# Patient Record
Sex: Male | Born: 1985 | Race: White | Hispanic: No | Marital: Single | Smoking: Never smoker
Health system: Southern US, Community
[De-identification: ages and names within clinical notes are randomized; demographics above are authoritative.]

## PROBLEM LIST (undated history)

## (undated) DIAGNOSIS — C801 Malignant (primary) neoplasm, unspecified: Secondary | ICD-10-CM

## (undated) DIAGNOSIS — I10 Essential (primary) hypertension: Secondary | ICD-10-CM

## (undated) DIAGNOSIS — E079 Disorder of thyroid, unspecified: Secondary | ICD-10-CM

## (undated) HISTORY — PX: OTHER SURGICAL HISTORY: SHX169

---

## 2018-12-04 ENCOUNTER — Emergency Department (HOSPITAL_COMMUNITY)
Admission: EM | Admit: 2018-12-04 | Discharge: 2018-12-04 | Disposition: A | Discharge status: Home / Self Care | Payer: BLUE CROSS/BLUE SHIELD | Attending: Emergency Medicine | Admitting: Emergency Medicine

## 2018-12-04 ENCOUNTER — Other Ambulatory Visit: Payer: Self-pay

## 2018-12-04 ENCOUNTER — Emergency Department (HOSPITAL_COMMUNITY): Payer: BLUE CROSS/BLUE SHIELD

## 2018-12-04 ENCOUNTER — Encounter (HOSPITAL_COMMUNITY): Payer: Self-pay | Admitting: Emergency Medicine

## 2018-12-04 DIAGNOSIS — S99912A Unspecified injury of left ankle, initial encounter: Secondary | ICD-10-CM | POA: Diagnosis present

## 2018-12-04 DIAGNOSIS — Y929 Unspecified place or not applicable: Secondary | ICD-10-CM | POA: Insufficient documentation

## 2018-12-04 DIAGNOSIS — Y999 Unspecified external cause status: Secondary | ICD-10-CM | POA: Insufficient documentation

## 2018-12-04 DIAGNOSIS — S93402A Sprain of unspecified ligament of left ankle, initial encounter: Secondary | ICD-10-CM | POA: Diagnosis not present

## 2018-12-04 DIAGNOSIS — X58XXXA Exposure to other specified factors, initial encounter: Secondary | ICD-10-CM | POA: Diagnosis not present

## 2018-12-04 DIAGNOSIS — Z85850 Personal history of malignant neoplasm of thyroid: Secondary | ICD-10-CM | POA: Diagnosis not present

## 2018-12-04 DIAGNOSIS — Y939 Activity, unspecified: Secondary | ICD-10-CM | POA: Diagnosis not present

## 2018-12-04 HISTORY — DX: Disorder of thyroid, unspecified: E07.9

## 2018-12-04 HISTORY — DX: Malignant (primary) neoplasm, unspecified: C80.1

## 2018-12-04 MED ORDER — KETOROLAC TROMETHAMINE 30 MG/ML IJ SOLN
30.0000 mg | Freq: Once | INTRAMUSCULAR | Status: AC
  Administered 2018-12-04: 30 mg via INTRAMUSCULAR
  Filled 2018-12-04: qty 1

## 2018-12-04 MED ORDER — NAPROXEN 500 MG PO TABS: 500.0000 mg | ORAL_TABLET | Freq: Two times a day (BID) | ORAL | 0 refills | Status: AC

## 2018-12-04 NOTE — ED Notes (Signed)
RN placed icebag on patient's ankle. Obvious swelling and discoloration noted to left ankle on the medial side

## 2018-12-04 NOTE — ED Provider Notes (Signed)
[facility] DEPT Provider Note   CSN: [id number] Arrival date & time: 12/04/18  1048    History   Chief Complaint Chief Complaint  Patient presents with  . Ankle Pain    HPI Martin Fuller is a 33 y.o. male.     33 y.o male with a PMH of Thyroid disease presents to the ED with a chief complaint of left ankle pain x3 weeks.  Patient had an episode 3 weeks ago where he began feeling pain along his Achilles tendon on the left side, he reports he went to an urgent care where he was given a boot along with told to take Tylenol for his pain.  He reports removing this boot, states the pain has gotten worse, states that he did not have x-rays obtained.  He reports the pain is worse with ambulation, palpation, he also reports feeling a "knot "on his left ankle.  Patient describes the pain as a shooting pain radiating from the left ankle all the way to his upper thigh.  He has been taking Tylenol for his pain but reports no improvement in symptoms.  Patient has not followed up with Ortho.  He denies any fevers, other injuries.     Past Medical History:  Diagnosis Date  . Cancer ([place])    thyroid  . Thyroid disease     There are no active problems to display for this patient.   Past Surgical History:  Procedure Laterality Date  . thyroidect          Home Medications    Prior to Admission medications   Medication Sig Start Date End Date Taking? Authorizing Provider  naproxen (NAPROSYN) 500 MG tablet Take 1 tablet (500 mg total) by mouth 2 (two) times daily for 7 days. 12/04/18 12/11/18  Janeece Fitting, PA-C    Family History No family history on file.  Social History Social History   Tobacco Use  . Smoking status: Never Smoker  . Smokeless tobacco: Never Used  Substance Use Topics  . Alcohol use: Yes  . Drug use: Not on file     Allergies   Patient has no known allergies.   Review of Systems Review of Systems  Constitutional: Negative  for chills and fever.  HENT: Negative for ear pain and sore throat.   Eyes: Negative for pain and visual disturbance.  Respiratory: Negative for cough and shortness of breath.   Cardiovascular: Negative for chest pain and palpitations.  Gastrointestinal: Negative for abdominal pain and vomiting.  Genitourinary: Negative for dysuria and hematuria.  Musculoskeletal: Positive for arthralgias and myalgias. Negative for back pain.  Skin: Negative for color change and rash.  Neurological: Negative for seizures and syncope.  All other systems reviewed and are negative.    Physical Exam Updated Vital Signs BP 116/65 (BP Location: Left Arm)   Pulse (!) 57   Temp 98.1 F (36.7 C) (Oral)   Resp 18   Ht 6\' 2"  (1.88 m)   Wt 80.7 kg   SpO2 99%   BMI 22.85 kg/m   Physical Exam Vitals signs and nursing note reviewed.  Constitutional:      Appearance: He is well-developed.  HENT:     Head: Normocephalic and atraumatic.  Eyes:     General: No scleral icterus.    Pupils: Pupils are equal, round, and reactive to light.  Neck:     Musculoskeletal: Normal range of motion.  Cardiovascular:     Heart sounds: Normal heart sounds.  Pulmonary:     Effort: Pulmonary effort is normal.     Breath sounds: Normal breath sounds. No wheezing.  Chest:     Chest wall: No tenderness.  Abdominal:     General: Bowel sounds are normal. There is no distension.     Palpations: Abdomen is soft.     Tenderness: There is no abdominal tenderness.  Musculoskeletal:        General: No tenderness or deformity.     Left ankle: He exhibits swelling. He exhibits normal range of motion.     Comments: Pulses present, capillary refill is intact.  Mild swelling noted to the left ankle.  Full range of motion.  Pain with plantarflexion, strength 5 out of 5 with dorsiflexion and plantarflexion.  Skin:    General: Skin is warm and dry.  Neurological:     Mental Status: He is alert and oriented to person, place, and  time.      ED Treatments / Results  Labs (all labs ordered are listed, but only abnormal results are displayed) Labs Reviewed - No data to display  EKG None  Radiology Dg Ankle 2 Views Left  Result Date: 12/04/2018 CLINICAL DATA:  Left foot and ankle pain. Achilles tendon injury 3 weeks ago. EXAM: LEFT ANKLE - 2 VIEW COMPARISON:  None. FINDINGS: There is no evidence of fracture, dislocation, or joint effusion. There is no evidence of arthropathy or other focal bone abnormality. Soft tissues are unremarkable. IMPRESSION: Normal examination. Electronically Signed   By: Claudie Revering M.D.   On: 12/04/2018 13:21    Procedures Procedures (including critical care time)  Medications Ordered in ED Medications  ketorolac (TORADOL) 30 MG/ML injection 30 mg (has no administration in time range)     Initial Impression / Assessment and Plan / ED Course  I have reviewed the triage vital signs and the nursing notes.  Pertinent labs & imaging results that were available during my care of the patient were reviewed by me and considered in my medical decision making (see chart for details).    Patient with no PMH the ED with chief complaint of left ankle pain x3 weeks.  Patient was originally seen in urgent care, thought to be Achilles tendinitis, was given a air boot along with Tylenol for pain.  HE reports his symptoms are not improving, he continues to have swelling to the left ankle reports his symptoms have not improved.  During evaluation there is swelling noted to the left ankle, no calf tenderness, does have pain with plantarflexion, no plantar dorsiflexion.  Strength is 5 out of 5, pulses are present and remains neurovascularly intact. X-ray of the left ankle showed: No effusion, dislocation, fracture.  Discussed with patient he will ultimately need orthopedic follow-up.  Will provide him with a splint to keep his foot in neutral, will also provide Toradol while in the ED, he will be going  home with a prescription for naproxen, rice therapy recommended.  Patient understands and agrees with management, with otherwise stable vital signs.  Patient stable for discharge.   Portions of this note were generated with [employer]. Dictation errors may occur despite best attempts at proofreading.   Final Clinical Impressions(s) / ED Diagnoses   Final diagnoses:  Sprain of left ankle, unspecified ligament, initial encounter    ED Discharge Orders         Ordered    naproxen (NAPROSYN) 500 MG tablet  2 times daily     12/04/18 1351  Janeece Fitting, PA-C 12/04/18 1412    Lennice Sites, DO 12/04/18 1513

## 2018-12-04 NOTE — Discharge Instructions (Addendum)
Your xray today was normal. The number to Dr. Mardelle Matte is attached to your chart, please schedule an appointment to further evaluate your left ankle sprain.

## 2018-12-04 NOTE — ED Triage Notes (Signed)
3 weeks injured achilles tendon and had a boot that wore for couple days and move it like told by UC. Reports that having pains in left foot/ankle area is very painful esp with weight bearing.

## 2019-09-05 ENCOUNTER — Ambulatory Visit: Payer: 59 | Attending: Internal Medicine

## 2019-09-05 DIAGNOSIS — Z23 Encounter for immunization: Secondary | ICD-10-CM

## 2019-09-05 NOTE — Progress Notes (Signed)
   Covid-19 Vaccination Clinic  Name:  Martin Fuller    MRN: [id number] DOB: 04-30-1986  09/05/2019  Martin Fuller was observed post Covid-19 immunization for 15 minutes without incident. He was provided with Vaccine Information Sheet and instruction to access the V-Safe system.   Martin Fuller was instructed to call 911 with any severe reactions post vaccine: [name] Kitchen Difficulty breathing  . Swelling of face and throat  . A fast heartbeat  . A bad rash all over body  . Dizziness and weakness   Immunizations Administered    Name Date Dose VIS Date Route   Pfizer COVID-19 Vaccine 09/05/2019 12:23 PM 0.3 mL 05/31/2019 Intramuscular   Manufacturer: [place]   Lot: [id number]   [place]: [id number]

## 2019-09-30 ENCOUNTER — Ambulatory Visit: Payer: 59 | Attending: Internal Medicine

## 2019-09-30 DIAGNOSIS — Z23 Encounter for immunization: Secondary | ICD-10-CM

## 2019-09-30 NOTE — Progress Notes (Signed)
   Covid-19 Vaccination Clinic  Name:  Martin Fuller    MRN: [id number] DOB: 03-07-1986  09/30/2019  Martin Fuller was observed post Covid-19 immunization for 15 minutes without incident. He was provided with Vaccine Information Sheet and instruction to access the V-Safe system.   Martin Fuller was instructed to call 911 with any severe reactions post vaccine: [name] Kitchen Difficulty breathing  . Swelling of face and throat  . A fast heartbeat  . A bad rash all over body  . Dizziness and weakness   Immunizations Administered    Name Date Dose VIS Date Route   Pfizer COVID-19 Vaccine 09/30/2019 11:02 AM 0.3 mL 05/31/2019 Intramuscular   Manufacturer: [place]   Lot: [id number]   [place]: [id number]

## 2020-10-26 ENCOUNTER — Emergency Department (HOSPITAL_COMMUNITY)
Admission: EM | Admit: 2020-10-26 | Discharge: 2020-10-26 | Disposition: A | Discharge status: Home / Self Care | Payer: 59 | Attending: Emergency Medicine | Admitting: Emergency Medicine

## 2020-10-26 ENCOUNTER — Other Ambulatory Visit: Payer: Self-pay

## 2020-10-26 DIAGNOSIS — R1084 Generalized abdominal pain: Secondary | ICD-10-CM | POA: Insufficient documentation

## 2020-10-26 DIAGNOSIS — Z85850 Personal history of malignant neoplasm of thyroid: Secondary | ICD-10-CM | POA: Diagnosis not present

## 2020-10-26 DIAGNOSIS — R197 Diarrhea, unspecified: Secondary | ICD-10-CM | POA: Diagnosis not present

## 2020-10-26 DIAGNOSIS — R1031 Right lower quadrant pain: Secondary | ICD-10-CM | POA: Diagnosis present

## 2020-10-26 LAB — URINALYSIS, ROUTINE W REFLEX MICROSCOPIC
Bacteria, UA: NONE SEEN
Bilirubin Urine: NEGATIVE
Glucose, UA: NEGATIVE mg/dL
Ketones, ur: NEGATIVE mg/dL
Leukocytes,Ua: NEGATIVE
Nitrite: NEGATIVE
Protein, ur: NEGATIVE mg/dL
Specific Gravity, Urine: 1.005 (ref 1.005–1.030)
pH: 7 (ref 5.0–8.0)

## 2020-10-26 LAB — CBC
HCT: 44.3 % (ref 39.0–52.0)
Hemoglobin: 15.2 g/dL (ref 13.0–17.0)
MCH: 30.5 pg (ref 26.0–34.0)
MCHC: 34.3 g/dL (ref 30.0–36.0)
MCV: 88.8 fL (ref 80.0–100.0)
Platelets: 292 10*3/uL (ref 150–400)
RBC: 4.99 MIL/uL (ref 4.22–5.81)
RDW: 13 % (ref 11.5–15.5)
WBC: 7.3 10*3/uL (ref 4.0–10.5)
nRBC: 0 % (ref 0.0–0.2)

## 2020-10-26 LAB — COMPREHENSIVE METABOLIC PANEL
ALT: 22 U/L (ref 0–44)
AST: 22 U/L (ref 15–41)
Albumin: 4.2 g/dL (ref 3.5–5.0)
Alkaline Phosphatase: 74 U/L (ref 38–126)
Anion gap: 6 (ref 5–15)
BUN: 8 mg/dL (ref 6–20)
CO2: 25 mmol/L (ref 22–32)
Calcium: 9.4 mg/dL (ref 8.9–10.3)
Chloride: 105 mmol/L (ref 98–111)
Creatinine, Ser: 0.86 mg/dL (ref 0.61–1.24)
GFR, Estimated: >60 mL/min (ref >60)
Glucose, Bld: 97 mg/dL (ref 70–99)
Potassium: 4.2 mmol/L (ref 3.5–5.1)
Sodium: 136 mmol/L (ref 135–145)
Total Bilirubin: 0.6 mg/dL (ref 0.3–1.2)
Total Protein: 7 g/dL (ref 6.5–8.1)

## 2020-10-26 LAB — LIPASE, BLOOD: Lipase: 28 U/L (ref 11–51)

## 2020-10-26 MED ORDER — AMOXICILLIN-POT CLAVULANATE 875-125 MG PO TABS: 1.0000 | ORAL_TABLET | Freq: Two times a day (BID) | ORAL | 0 refills | Status: DC

## 2020-10-26 NOTE — Discharge Instructions (Addendum)
Drink clear liquids until your stomach feels better. Then, slowly introduce bland foods into your diet as tolerated, such as bread, rice, apples, bananas. Follow up with your primary care if symptoms persist. Return to the ER for severe abdominal pain, large amounts of blood in your stool, fever, uncontrollable vomiting, or new or concerning symptoms.

## 2020-10-26 NOTE — ED Triage Notes (Signed)
Pt presents to the ED with abdominal cramping and diarrhea sudden onset last night at midnight. Pt states he also has been breaking out in sweats. Denies fevers.

## 2020-10-26 NOTE — ED Provider Notes (Addendum)
[facility] EMERGENCY DEPARTMENT Provider Note   CSN: [id number] Arrival date & time: 10/26/20  1109     History Chief Complaint  Patient presents with  . Abdominal Pain    Martin Fuller is a 35 y.o. male with PMHx thyroid cancer s/p thyroidectomy, presenting for evaluation of diarrhea and abdominal pain.  Patient states he woke up overnight and began having significant abdominal pain that was generalized in his abdomen with diarrhea.  He had many episodes of diarrhea.  Today his bowel movements are more mucousy with a tinge of bright red/pink blood.  He has some irritation in his rectum from the frequent bowel movements.  No known history of hemorrhoids.  His abdominal pain is much improved since overnight.  No recent travel.  His whole family had the same meal with him last night, none are sick.  No nausea or vomiting, no urinary symptoms, no fevers.  While he was having a bowel movement last night he did have a near syncopal episode where he felt very lightheaded, felt his vision going, felt diaphoretic.  He did not lose consciousness.   History of appendectomy.  No history of diverticulitis.  The history is provided by the patient.       Past Medical History:  Diagnosis Date  . Cancer ([place])    thyroid  . Thyroid disease     There are no problems to display for this patient.   Past Surgical History:  Procedure Laterality Date  . thyroidect         No family history on file.  Social History   Tobacco Use  . Smoking status: Never Smoker  . Smokeless tobacco: Never Used  Vaping Use  . Vaping Use: Some days  . Substances: Flavoring  Substance Use Topics  . Alcohol use: Yes    Home Medications Prior to Admission medications   Medication Sig Start Date End Date Taking? Authorizing Provider  amoxicillin-clavulanate (AUGMENTIN) 875-125 MG tablet Take 1 tablet by mouth every 12 (twelve) hours. 10/26/20  Yes Tommie Bohlken, [place] N, PA-C    Allergies     Patient has no known allergies.  Review of Systems   Review of Systems  Gastrointestinal: Positive for abdominal pain, blood in stool and diarrhea.  All other systems reviewed and are negative.      Physical Exam Updated Vital Signs BP 125/69   Pulse (!) 48   Temp 98 F (36.7 C) (Oral)   Resp 18   Ht 6\' 2"  (1.88 m)   Wt 81 kg   SpO2 97%   BMI 22.93 kg/m   Physical Exam Vitals and nursing note reviewed.  Constitutional:      General: He is not in acute distress.    Appearance: He is well-developed. He is not ill-appearing.  HENT:     Head: Normocephalic and atraumatic.  Eyes:     Conjunctiva/sclera: Conjunctivae normal.  Cardiovascular:     Rate and Rhythm: Normal rate and regular rhythm.  Pulmonary:     Effort: Pulmonary effort is normal. No respiratory distress.     Breath sounds: Normal breath sounds.  Abdominal:     General: Abdomen is flat. Bowel sounds are normal.     Palpations: Abdomen is soft.     Tenderness: There is abdominal tenderness in the right upper quadrant, right lower quadrant and left lower quadrant. There is no guarding or rebound.  Skin:    General: Skin is warm.  Neurological:  Mental Status: He is alert.  Psychiatric:        Behavior: Behavior normal.     ED Results / Procedures / Treatments   Labs (all labs ordered are listed, but only abnormal results are displayed) Labs Reviewed  URINALYSIS, ROUTINE W REFLEX MICROSCOPIC - Abnormal; Notable for the following components:      Result Value   Color, Urine STRAW (*)    Hgb urine dipstick SMALL (*)    All other components within normal limits  LIPASE, BLOOD  COMPREHENSIVE METABOLIC PANEL  CBC    EKG None  Radiology No results found.  Procedures Procedures   Medications Ordered in ED Medications - No data to display  ED Course  I have reviewed the triage vital signs and the nursing notes.  Pertinent labs & imaging results that were available during my care of the  patient were reviewed by me and considered in my medical decision making (see chart for details).    MDM Rules/Calculators/A&P                          Presenting for generalized abdominal pain, diarrhea that began last night.  Abdominal pain is improving.  He is mostly generalized tenderness without guarding or rebound.  He is also having some bloody mucousy stool today.  Patient refused rectal exam.  Blood work is very reassuring, no leukocytosis, hemoglobin is within normal limits, no electrolyte derangements, normal renal and hepatic function, normal lipase.  He is afebrile with stable vital signs.  Also discussed CT imaging of his abdomen and had shared decision making.  Patient would like to avoid imaging, believe this is very reasonable. He does not have an acute abdomen. Suspect this is viral though discussed potential for diverticulitis (seems less likely for age) or other infectious colitis.  Will cover with Augmentin, instructed oral hydration clear liquid diet and slowly advance as tolerated.  Close PCP follow-up, strict return precautions.  Patient is discharged in no distress.  Discussed results, findings, treatment and follow up. Patient advised of return precautions. Patient verbalized understanding and agreed with plan.  Final Clinical Impression(s) / ED Diagnoses Final diagnoses:  Diarrhea, unspecified type  Generalized abdominal pain    Rx / DC Orders ED Discharge Orders         Ordered    amoxicillin-clavulanate (AUGMENTIN) 875-125 MG tablet  Every 12 hours        10/26/20 1559           [place] Lozito, [place] N, PA-C 10/26/20 1602    Stori Royse, [place] N, PA-C 10/26/20 2956    Varney Biles, MD 10/27/20 0002

## 2021-01-22 ENCOUNTER — Emergency Department (HOSPITAL_COMMUNITY): Payer: 59

## 2021-01-22 ENCOUNTER — Emergency Department (HOSPITAL_COMMUNITY)
Admission: EM | Admit: 2021-01-22 | Discharge: 2021-01-23 | Disposition: A | Discharge status: Home / Self Care | Payer: 59 | Attending: Emergency Medicine | Admitting: Emergency Medicine

## 2021-01-22 ENCOUNTER — Other Ambulatory Visit: Payer: Self-pay

## 2021-01-22 ENCOUNTER — Encounter (HOSPITAL_COMMUNITY): Payer: Self-pay

## 2021-01-22 DIAGNOSIS — R0789 Other chest pain: Secondary | ICD-10-CM | POA: Diagnosis present

## 2021-01-22 DIAGNOSIS — R0602 Shortness of breath: Secondary | ICD-10-CM | POA: Insufficient documentation

## 2021-01-22 DIAGNOSIS — R079 Chest pain, unspecified: Secondary | ICD-10-CM

## 2021-01-22 DIAGNOSIS — Z85850 Personal history of malignant neoplasm of thyroid: Secondary | ICD-10-CM | POA: Diagnosis not present

## 2021-01-22 DIAGNOSIS — F419 Anxiety disorder, unspecified: Secondary | ICD-10-CM | POA: Diagnosis not present

## 2021-01-22 LAB — BASIC METABOLIC PANEL
Anion gap: 10 (ref 5–15)
BUN: 9 mg/dL (ref 6–20)
CO2: 23 mmol/L (ref 22–32)
Calcium: 9.1 mg/dL (ref 8.9–10.3)
Chloride: 103 mmol/L (ref 98–111)
Creatinine, Ser: 1.02 mg/dL (ref 0.61–1.24)
GFR, Estimated: >60 mL/min (ref >60)
Glucose, Bld: 96 mg/dL (ref 70–99)
Potassium: 3.6 mmol/L (ref 3.5–5.1)
Sodium: 136 mmol/L (ref 135–145)

## 2021-01-22 LAB — CBC WITH DIFFERENTIAL/PLATELET
Abs Immature Granulocytes: 0.04 10*3/uL (ref 0.00–0.07)
Basophils Absolute: 0.1 10*3/uL (ref 0.0–0.1)
Basophils Relative: 0 %
Eosinophils Absolute: 0.1 10*3/uL (ref 0.0–0.5)
Eosinophils Relative: 1 %
HCT: 43.2 % (ref 39.0–52.0)
Hemoglobin: 15.2 g/dL (ref 13.0–17.0)
Immature Granulocytes: 0 %
Lymphocytes Relative: 16 %
Lymphs Abs: 1.9 10*3/uL (ref 0.7–4.0)
MCH: 30.6 pg (ref 26.0–34.0)
MCHC: 35.2 g/dL (ref 30.0–36.0)
MCV: 86.9 fL (ref 80.0–100.0)
Monocytes Absolute: 0.8 10*3/uL (ref 0.1–1.0)
Monocytes Relative: 7 %
Neutro Abs: 8.7 10*3/uL — ABNORMAL HIGH (ref 1.7–7.7)
Neutrophils Relative %: 76 %
Platelets: 310 10*3/uL (ref 150–400)
RBC: 4.97 MIL/uL (ref 4.22–5.81)
RDW: 12.9 % (ref 11.5–15.5)
WBC: 11.5 10*3/uL — ABNORMAL HIGH (ref 4.0–10.5)
nRBC: 0 % (ref 0.0–0.2)

## 2021-01-22 LAB — TROPONIN I (HIGH SENSITIVITY): Troponin I (High Sensitivity): 11 ng/L (ref <18)
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
## 2021-01-22 NOTE — ED Provider Notes (Signed)
Emergency Medicine Provider Triage Evaluation Note  Martin Fuller , a 35 y.o. male  was evaluated in triage.  Pt complains of chest pain.  Chest pain started 1.5 hours prior.  Chest pain is located to the left side of his chest.  Patient describes pain as a dullness bordering on sharp pain.  No radiation of pain.  No alleviating or aggravating factors.  Patient endorses associated diaphoresis at onset of pain.  Patient also endorses shortness of breath.  No nausea, vomiting, abdominal pain, back pain, palpitations, leg swelling.  Patient denies any history of PE or DVT, cancer treatment, hormone therapy, surgery in the last 4 weeks, hemoptysis.  Review of Systems  Positive: Chest pain, diaphoresis, shortness of breath Negative: nausea, vomiting, abdominal pain, back pain, palpitations, leg swelling  Physical Exam  BP (!) 149/101 (BP Location: Right Arm)   Pulse 89   Temp 98.3 F (36.8 C)   Resp 18   Ht '6\' 2"'$  (1.88 m)   Wt 93.4 kg   SpO2 98%   BMI 26.45 kg/m  Gen:   Awake, no distress   Resp:  Normal effort, lungs clear to auscultation bilaterally MSK:   Moves extremities without difficulty, no swelling or tenderness to bilateral lower extremities Other:  +2 Radial pulse bilaterally.  Medical Decision Making  Medically screening exam initiated at 8:11 PM.  Appropriate orders placed.  Saksham Hardebeck was informed that the remainder of the evaluation will be completed by another provider, this initial triage assessment does not replace that evaluation, and the importance of remaining in the ED until their evaluation is complete.  The patient appears stable so that the remainder of the work up may be completed by another provider.      Dyann Ruddle 01/22/21 2013    Drenda Freeze, MD 01/22/21 [phone]

## 2021-01-23 LAB — TROPONIN I (HIGH SENSITIVITY): Troponin I (High Sensitivity): 15 ng/L (ref <18)

## 2021-01-23 NOTE — ED Notes (Signed)
Pt  Left without his instructions

## 2021-01-23 NOTE — Discharge Instructions (Addendum)
You were evaluated in the Emergency Department and after careful evaluation, we did not find any emergent condition requiring admission or further testing in the hospital.  Your exam/testing today was overall reassuring.  Symptoms likely due to bruising or strain of the muscles of the chest wall.  Please return to the Emergency Department if you experience any worsening of your condition.  Thank you for allowing [place] to be a part of your care.

## 2021-01-23 NOTE — ED Notes (Signed)
Pt left before vitals could be  repeated
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
## 2021-01-28 ENCOUNTER — Other Ambulatory Visit: Payer: Self-pay

## 2021-01-28 ENCOUNTER — Ambulatory Visit
Admission: EM | Admit: 2021-01-28 | Discharge: 2021-01-28 | Disposition: A | Discharge status: Home / Self Care | Payer: 59 | Attending: Urgent Care | Admitting: Urgent Care

## 2021-01-28 DIAGNOSIS — R9431 Abnormal electrocardiogram [ECG] [EKG]: Secondary | ICD-10-CM | POA: Insufficient documentation

## 2021-01-28 DIAGNOSIS — R0789 Other chest pain: Secondary | ICD-10-CM | POA: Insufficient documentation

## 2021-01-28 MED ORDER — NAPROXEN 500 MG PO TABS: 500.0000 mg | ORAL_TABLET | Freq: Two times a day (BID) | ORAL | 0 refills | Status: AC

## 2021-01-28 MED ORDER — TIZANIDINE HCL 4 MG PO TABS: 4.0000 mg | ORAL_TABLET | Freq: Three times a day (TID) | ORAL | 0 refills | Status: AC | PRN

## 2021-01-28 NOTE — Discharge Instructions (Addendum)
Please contact Dr. Irven Shelling office today for a follow up on your severe chest pain that I suspect is musculoskeletal. However, given the severity and slight abnormalities on your ekg warrants a follow up with a heart doctor.
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
## 2021-01-28 NOTE — ED Provider Notes (Signed)
[place]   MRN: [id number] DOB: [birth year]  Subjective:   Martin Fuller is a 35 y.o. male presenting for recheck on ongoing severe 10 out of 10 sharp left-sided chest wall pain.  Patient states that moving the torso, lifting his arms above shoulder height causes exquisite tenderness of his pain with associated shortness of breath.  Symptoms started from an injury with a door, refer to the notes from his emergency room visit for further detail.  He did have thorough work-up through the emergency room and can refer to the results below.  Has been using Tylenol and 400 mg of ibuprofen alternating between the 2 daily with out any relief of his symptoms.  Denies any fever, runny or stuffy nose, cough, body aches, shortness of breath, rashes.  He does note some bruising that is resolving over the chest area.  No history of arrhythmias, family history of heart conditions.  No drug use.  Takes levothyroxine.  No Known Allergies  Past Medical History:  Diagnosis Date   Cancer ([place])    thyroid   Thyroid disease      Past Surgical History:  Procedure Laterality Date   thyroidect      History reviewed. No pertinent family history.  Social History   Tobacco Use   Smoking status: Never   Smokeless tobacco: Never  Vaping Use   Vaping Use: Some days   Substances: Flavoring  Substance Use Topics   Alcohol use: Yes    ROS   Objective:   Vitals: BP 122/77 (BP Location: Left Arm)   Pulse (!) 59   Temp 97.9 F (36.6 C) (Oral)   Resp 18   SpO2 97%   Physical Exam Constitutional:      General: He is not in acute distress.    Appearance: Normal appearance. He is well-developed. He is not ill-appearing, toxic-appearing or diaphoretic.  HENT:     Head: Normocephalic and atraumatic.     Right Ear: External ear normal.     Left Ear: External ear normal.     Nose: Nose normal.     Mouth/Throat:     Mouth: Mucous membranes are moist.     Pharynx: Oropharynx is  clear.  Eyes:     General: No scleral icterus.    Extraocular Movements: Extraocular movements intact.     Pupils: Pupils are equal, round, and reactive to light.  Cardiovascular:     Rate and Rhythm: Normal rate and regular rhythm.     Heart sounds: Normal heart sounds. No murmur heard.   No friction rub. No gallop.  Pulmonary:     Effort: Pulmonary effort is normal. No respiratory distress.     Breath sounds: Normal breath sounds. No stridor. No wheezing, rhonchi or rales.  Chest:     Chest wall: Tenderness (reproducible over area outlined with associated resolving ecchymosis) present.    Neurological:     Mental Status: He is alert and oriented to person, place, and time.  Psychiatric:        Mood and Affect: Mood normal.        Behavior: Behavior normal.        Thought Content: Thought content normal.    ED ECG REPORT   Date: 01/28/2021  EKG Time: 10:24 AM  Rate: 53 bpm  Rhythm: sinus bradycardia,  sinus bradycardia  Axis: Normal  Intervals:none  ST&T Change: Possible slight ST elevation in V2-V6 and leads I, 2, aVF, T wave flattening in lead aVL  Narrative Interpretation: Sinus bradycardia 53 bpm with possible diffuse mild ST elevation as above, early repolarization.  EKG is slightly different from the one done in the emergency room.   DG Chest 2 View  Result Date: 01/22/2021 CLINICAL DATA:  Chest pain EXAM: CHEST - 2 VIEW COMPARISON:  None. FINDINGS: The heart size and mediastinal contours are within normal limits. Both lungs are clear. The visualized skeletal structures are unremarkable. IMPRESSION: No active cardiopulmonary disease. Electronically Signed   By: Ulyses Jarred M.D.   On: 01/22/2021 21:26    Recent Results (from the past 2160 hour(s))  Troponin I (High Sensitivity)     Status: None   Collection Time: 01/22/21  8:16 PM  Result Value Ref Range   Troponin I (High Sensitivity) 11 <18 ng/L    Comment: (NOTE) Elevated high sensitivity troponin I (hsTnI)  values and significant  changes across serial measurements may suggest ACS but many other  chronic and acute conditions are known to elevate hsTnI results.  Refer to the "Links" section for chest pain algorithms and additional  guidance. Performed at [facility], [place] [address] [place], [place] [id number]   Basic metabolic panel     Status: None   Collection Time: 01/22/21  8:16 PM  Result Value Ref Range   Sodium 136 135 - 145 mmol/L   Potassium 3.6 3.5 - 5.1 mmol/L   Chloride 103 98 - 111 mmol/L   CO2 23 22 - 32 mmol/L   Glucose, Bld 96 70 - 99 mg/dL    Comment: Glucose reference range applies only to samples taken after fasting for at least 8 hours.   BUN 9 6 - 20 mg/dL   Creatinine, Ser 1.02 0.61 - 1.24 mg/dL   Calcium 9.1 8.9 - 10.3 mg/dL   GFR, Estimated >60 >60 mL/min    Comment: (NOTE) Calculated using the CKD-EPI Creatinine Equation (2021)    Anion gap 10 5 - 15    Comment: Performed at [facility] [address], [place], [place] [address]  CBC with Differential     Status: Abnormal   Collection Time: 01/22/21  8:16 PM  Result Value Ref Range   WBC 11.5 (H) 4.0 - 10.5 K/uL   RBC 4.97 4.22 - 5.81 MIL/uL   Hemoglobin 15.2 13.0 - 17.0 g/dL   HCT 43.2 39.0 - 52.0 %   MCV 86.9 80.0 - 100.0 fL   MCH 30.6 26.0 - 34.0 pg   MCHC 35.2 30.0 - 36.0 g/dL   RDW 12.9 11.5 - 15.5 %   Platelets 310 150 - 400 K/uL   nRBC 0.0 0.0 - 0.2 %   Neutrophils Relative % 76 %   Neutro Abs 8.7 (H) 1.7 - 7.7 K/uL   Lymphocytes Relative 16 %   Lymphs Abs 1.9 0.7 - 4.0 K/uL   Monocytes Relative 7 %   Monocytes Absolute 0.8 0.1 - 1.0 K/uL   Eosinophils Relative 1 %   Eosinophils Absolute 0.1 0.0 - 0.5 K/uL   Basophils Relative 0 %   Basophils Absolute 0.1 0.0 - 0.1 K/uL   Immature Granulocytes 0 %   Abs Immature Granulocytes 0.04 0.00 - 0.07 K/uL    Comment: Performed at [facility] [address] [place], [place] [address]  Troponin I (High Sensitivity)      Status: None   Collection Time: 01/22/21 10:56 PM  Result Value Ref Range   Troponin I (High Sensitivity) 15 <18 ng/L  Comment: (NOTE) Elevated high sensitivity troponin I (hsTnI) values and significant  changes across serial measurements may suggest ACS but many other  chronic and acute conditions are known to elevate hsTnI results.  Refer to the "Links" section for chest pain algorithms and additional  guidance. Performed at [facility], [place] [address], [place], [place] [address]      Assessment and Plan :   PDMP not reviewed this encounter.  1. Chest wall pain   2. Nonspecific abnormal electrocardiogram (ECG) (EKG)     My primary suspicion is that patient is having musculoskeletal chest wall pain secondary to his injury.  Recommended taking naproxen, tizanidine.  It is possible patient is having developing signs of pericarditis but his work-up from the ER was negative.  The EKG shows slight ST elevations but not frank enough to warrant another ER visit.  However, patient does have significant concern for his heart and therefore given his severe chest symptoms, technically abnormal EKG will refer to Dr. Einar Gip with [facility] cardiovascular. Counseled patient on potential for adverse effects with medications prescribed/recommended today, ER and return-to-clinic precautions discussed, patient verbalized understanding.    Jaynee Eagles, PA-C 01/28/21 1029

## 2021-02-04 ENCOUNTER — Ambulatory Visit: Payer: 59 | Admitting: Cardiology

## 2021-02-20 ENCOUNTER — Other Ambulatory Visit: Payer: Self-pay

## 2021-02-20 ENCOUNTER — Emergency Department (HOSPITAL_COMMUNITY)
Admission: EM | Admit: 2021-02-20 | Discharge: 2021-02-20 | Disposition: A | Discharge status: Home / Self Care | Payer: 59 | Attending: Emergency Medicine | Admitting: Emergency Medicine

## 2021-02-20 ENCOUNTER — Encounter (HOSPITAL_COMMUNITY): Payer: Self-pay

## 2021-02-20 DIAGNOSIS — Z85850 Personal history of malignant neoplasm of thyroid: Secondary | ICD-10-CM | POA: Insufficient documentation

## 2021-02-20 DIAGNOSIS — S0101XA Laceration without foreign body of scalp, initial encounter: Secondary | ICD-10-CM | POA: Diagnosis not present

## 2021-02-20 DIAGNOSIS — W28XXXA Contact with powered lawn mower, initial encounter: Secondary | ICD-10-CM | POA: Insufficient documentation

## 2021-02-20 DIAGNOSIS — S0181XA Laceration without foreign body of other part of head, initial encounter: Secondary | ICD-10-CM | POA: Insufficient documentation

## 2021-02-20 DIAGNOSIS — Z23 Encounter for immunization: Secondary | ICD-10-CM | POA: Diagnosis not present

## 2021-02-20 DIAGNOSIS — I10 Essential (primary) hypertension: Secondary | ICD-10-CM | POA: Diagnosis not present

## 2021-02-20 DIAGNOSIS — S0990XA Unspecified injury of head, initial encounter: Secondary | ICD-10-CM | POA: Diagnosis present

## 2021-02-20 HISTORY — DX: Essential (primary) hypertension: I10

## 2021-02-20 MED ORDER — TETANUS-DIPHTH-ACELL PERTUSSIS 5-2.5-18.5 LF-MCG/0.5 IM SUSY
0.5000 mL | PREFILLED_SYRINGE | Freq: Once | INTRAMUSCULAR | Status: AC
  Administered 2021-02-20: 0.5 mL via INTRAMUSCULAR
  Filled 2021-02-20: qty 0.5

## 2021-02-20 MED ORDER — CEPHALEXIN 500 MG PO CAPS: 500.0000 mg | ORAL_CAPSULE | Freq: Three times a day (TID) | ORAL | 0 refills | Status: AC

## 2021-02-20 MED ORDER — LIDOCAINE HCL (PF) 1 % IJ SOLN
5.0000 mL | Freq: Once | INTRAMUSCULAR | Status: AC
  Administered 2021-02-20: 5 mL
  Filled 2021-02-20: qty 5

## 2021-02-20 NOTE — Discharge Instructions (Addendum)
Return in 7 days to remove the sutures.    Keep the wound dry for 48 hours.   Keep the wound covered for 5 days.   Apply bacitracin or Neosporin type ointment on the wound.  After 2 days you may wash gently with soap and water do not do prolonged soaks.  Return immediately back to the ER if:  Your symptoms worsen within the next 12-24 hours. You develop new symptoms such as new fevers, persistent vomiting, new pain, shortness of breath, or new weakness or numbness, or if you have any other concerns.

## 2021-02-20 NOTE — ED Triage Notes (Signed)
Pt from home BIB EMS for head laceration. Pt was mowing lawn and ran into the clothes line pole. Fire reports a 4-5 inch lac on head. Pressure dressing/wrap applied by fire. C/o forehead pain. 124mg Fentanyl IV given in route. No neuro deficits. Pt A/Ox4 on arrival.

## 2021-02-20 NOTE — ED Provider Notes (Signed)
[facility] EMERGENCY DEPARTMENT Provider Note   CSN: [id number] Arrival date & time: 02/20/21  1030     History Chief Complaint  Patient presents with   Head Laceration    Martin Fuller is a 35 y.o. male.  Patient presents with forehead laceration.  He states he was mowing his lawn when he was not looking had had his head turned and when he turned forward again he ran into a pole.  Denies loss of consciousness.  Denies headache denies vomiting denies nausea.  Denies fevers or cough.      Past Medical History:  Diagnosis Date   Cancer ([place])    thyroid   Hypertension    Thyroid disease     There are no problems to display for this patient.   Past Surgical History:  Procedure Laterality Date   thyroidect         No family history on file.  Social History   Tobacco Use   Smoking status: Never   Smokeless tobacco: Never  Vaping Use   Vaping Use: Some days   Substances: Flavoring  Substance Use Topics   Alcohol use: Yes    Home Medications Prior to Admission medications   Medication Sig Start Date End Date Taking? Authorizing Provider  cephALEXin (KEFLEX) 500 MG capsule Take 1 capsule (500 mg total) by mouth 3 (three) times daily for 4 days. 02/20/21 02/24/21 Yes Luna Fuse, MD  levothyroxine (SYNTHROID) 175 MCG tablet Take 175 mcg by mouth daily before breakfast.    [provider]  naproxen (NAPROSYN) 500 MG tablet Take 1 tablet (500 mg total) by mouth 2 (two) times daily with a meal. 01/28/21   Jaynee Eagles, PA-C  tiZANidine (ZANAFLEX) 4 MG tablet Take 1 tablet (4 mg total) by mouth every 8 (eight) hours as needed for muscle spasms. 01/28/21   Jaynee Eagles, PA-C    Allergies    Patient has no known allergies.  Review of Systems   Review of Systems  Constitutional:  Negative for fever.  HENT:  Negative for ear pain and sore throat.   Eyes:  Negative for pain.  Respiratory:  Negative for cough.   Cardiovascular:  Negative for  chest pain.  Gastrointestinal:  Negative for abdominal pain.  Genitourinary:  Negative for flank pain.  Musculoskeletal:  Negative for back pain.  Skin:  Negative for color change and rash.  Neurological:  Negative for syncope.  All other systems reviewed and are negative.  Physical Exam Updated Vital Signs BP 136/80   Pulse (!) 56   Temp 97.9 F (36.6 C)   Resp 20   Ht '6\' 3"'$  (1.905 m)   SpO2 91%   BMI 25.75 kg/m   Physical Exam Constitutional:      Appearance: He is well-developed.  HENT:     Head: Normocephalic.     Comments: 6 cm curvilinear laceration on the right forehead.    Nose: Nose normal.  Eyes:     Extraocular Movements: Extraocular movements intact.  Cardiovascular:     Rate and Rhythm: Normal rate.  Pulmonary:     Effort: Pulmonary effort is normal.  Skin:    Coloration: Skin is not jaundiced.     Comments: 6 cm curvilinear laceration of the right forehead  Neurological:     General: No focal deficit present.     Mental Status: He is alert and oriented to person, place, and time. Mental status is at baseline.  Cranial Nerves: No cranial nerve deficit.    ED Results / Procedures / Treatments   Labs (all labs ordered are listed, but only abnormal results are displayed) Labs Reviewed - No data to display  EKG None  Radiology No results found.  Procedures .[name] KitchenLaceration Repair  Date/Time: 02/20/2021 11:30 AM Performed by: Luna Fuse, MD Authorized by: Luna Fuse, MD   Consent:    Consent obtained:  Verbal   Risks discussed:  Infection, pain, poor cosmetic result, need for additional repair, nerve damage and poor wound healing Comments:     6 cm laceration noted right forehead.  No foreign body noted in bloodless field.  Wound thoroughly irrigated with 5 cc of sterile water with drops of Betadine.  Local lidocaine 1% no epinephrine total of 5 cc instilled.  Wound edges approximated with 5-0 Ethilon sutures approximately 8 sutures  placed.  Patient tolerated procedure well, no active bleeding noted afterwards.  Dressing placed after bacitracin ointment applied.    Medications Ordered in ED Medications  Tdap (BOOSTRIX) injection 0.5 mL (0.5 mLs Intramuscular Given 02/20/21 1102)  lidocaine (PF) (XYLOCAINE) 1 % injection 5 mL (5 mLs Infiltration Given 02/20/21 1103)    ED Course  I have reviewed the triage vital signs and the nursing notes.  Pertinent labs & imaging results that were available during my care of the patient were reviewed by me and considered in my medical decision making (see chart for details).    MDM Rules/Calculators/A&P                           By standard wound care directions.  Advised return in 7 days for suture removal.  Advised immediate return for signs of infection such as purulent drainage redness pus fevers pain or any additional concerns.  CT imaging considered, however this is a low energy mechanism, no loss of consciousness, no nausea no vomiting, frontal injury with no complaints of headache.  Final Clinical Impression(s) / ED Diagnoses Final diagnoses:  Laceration of scalp without foreign body, initial encounter    Rx / DC Orders ED Discharge Orders          Ordered    cephALEXin (KEFLEX) 500 MG capsule  3 times daily        02/20/21 1133             [place], Greggory Brandy, MD 02/20/21 1134

## 2021-05-19 ENCOUNTER — Other Ambulatory Visit: Payer: Self-pay

## 2021-05-19 ENCOUNTER — Emergency Department (HOSPITAL_BASED_OUTPATIENT_CLINIC_OR_DEPARTMENT_OTHER): Payer: 59 | Admitting: Radiology

## 2021-05-19 ENCOUNTER — Encounter (HOSPITAL_BASED_OUTPATIENT_CLINIC_OR_DEPARTMENT_OTHER): Payer: Self-pay

## 2021-05-19 ENCOUNTER — Emergency Department (HOSPITAL_BASED_OUTPATIENT_CLINIC_OR_DEPARTMENT_OTHER)
Admission: EM | Admit: 2021-05-19 | Discharge: 2021-05-19 | Disposition: A | Discharge status: Home / Self Care | Payer: 59 | Attending: Emergency Medicine | Admitting: Emergency Medicine

## 2021-05-19 DIAGNOSIS — Z20822 Contact with and (suspected) exposure to covid-19: Secondary | ICD-10-CM | POA: Diagnosis not present

## 2021-05-19 DIAGNOSIS — J069 Acute upper respiratory infection, unspecified: Secondary | ICD-10-CM

## 2021-05-19 DIAGNOSIS — R079 Chest pain, unspecified: Secondary | ICD-10-CM | POA: Insufficient documentation

## 2021-05-19 DIAGNOSIS — I10 Essential (primary) hypertension: Secondary | ICD-10-CM | POA: Insufficient documentation

## 2021-05-19 DIAGNOSIS — R0602 Shortness of breath: Secondary | ICD-10-CM | POA: Diagnosis not present

## 2021-05-19 DIAGNOSIS — R509 Fever, unspecified: Secondary | ICD-10-CM | POA: Insufficient documentation

## 2021-05-19 DIAGNOSIS — R059 Cough, unspecified: Secondary | ICD-10-CM | POA: Diagnosis present

## 2021-05-19 DIAGNOSIS — Z85850 Personal history of malignant neoplasm of thyroid: Secondary | ICD-10-CM | POA: Diagnosis not present

## 2021-05-19 LAB — CBC
HCT: 43.3 % (ref 39.0–52.0)
Hemoglobin: 15.1 g/dL (ref 13.0–17.0)
MCH: 30.3 pg (ref 26.0–34.0)
MCHC: 34.9 g/dL (ref 30.0–36.0)
MCV: 86.8 fL (ref 80.0–100.0)
Platelets: 282 10*3/uL (ref 150–400)
RBC: 4.99 MIL/uL (ref 4.22–5.81)
RDW: 13.2 % (ref 11.5–15.5)
WBC: 11.2 10*3/uL — ABNORMAL HIGH (ref 4.0–10.5)
nRBC: 0 % (ref 0.0–0.2)

## 2021-05-19 LAB — RESP PANEL BY RT-PCR (FLU A&B, COVID) ARPGX2
Influenza A by PCR: NEGATIVE
Influenza B by PCR: NEGATIVE
SARS Coronavirus 2 by RT PCR: NEGATIVE

## 2021-05-19 LAB — BASIC METABOLIC PANEL
Anion gap: 9 (ref 5–15)
BUN: 7 mg/dL (ref 6–20)
CO2: 25 mmol/L (ref 22–32)
Calcium: 9.6 mg/dL (ref 8.9–10.3)
Chloride: 104 mmol/L (ref 98–111)
Creatinine, Ser: 0.82 mg/dL (ref 0.61–1.24)
GFR, Estimated: >60 mL/min (ref >60)
Glucose, Bld: 123 mg/dL — ABNORMAL HIGH (ref 70–99)
Potassium: 3.8 mmol/L (ref 3.5–5.1)
Sodium: 138 mmol/L (ref 135–145)

## 2021-05-19 LAB — TROPONIN I (HIGH SENSITIVITY): Troponin I (High Sensitivity): 3 ng/L (ref <18)

## 2021-05-19 MED ORDER — BENZONATATE 100 MG PO CAPS: 100.0000 mg | ORAL_CAPSULE | Freq: Three times a day (TID) | ORAL | 0 refills | Status: AC

## 2021-05-19 NOTE — ED Triage Notes (Signed)
Patient here POV from Home with CP, Cough, and associated SOB.  Symptoms began today with Acute Onset. No Fever.  NAD Noted during Triage. A&Ox4. GCS 15. Ambulatory.

## 2021-05-19 NOTE — ED Provider Notes (Addendum)
[facility] EMERGENCY DEPT Provider Note   CSN: [id number] Arrival date & time: 05/19/21  1143     History Chief Complaint  Patient presents with   Cough   Chest Pain    Martin Fuller is a 35 y.o. male.   Cough Associated symptoms: chest pain, fever and shortness of breath   Associated symptoms: no headaches, no myalgias and no sore throat   Chest Pain Associated symptoms: cough, fever and shortness of breath   Associated symptoms: no back pain, no headache, no nausea and no vomiting    Patient with history of hypertension and thyroid cancer status post thyroidectomy presents due to chest pain, cough, shortness of breath x1 day.  He reports he started having associated symptoms of fevers and chills after Thanksgiving, his daughter had similar symptoms and tested positive for RSV. Last night he started having chest pain which is been constant.  It feels like a dull pain, but is intermittently sharp whenever he coughs.  Cough is nonproductive, it is intermittent without any provoking or aggravating factors that he is aware of.  It associated with shortness of breath.  Has not tried any alleviating factors, denies any obvious provoking factors.  No history of prior cardiac events or blood clots.  Past Medical History:  Diagnosis Date   Cancer ([place])    thyroid   Hypertension    Thyroid disease     There are no problems to display for this patient.   Past Surgical History:  Procedure Laterality Date   thyroidect         No family history on file.  Social History   Tobacco Use   Smoking status: Never   Smokeless tobacco: Never  Vaping Use   Vaping Use: Some days   Substances: Flavoring  Substance Use Topics   Alcohol use: Yes    Home Medications Prior to Admission medications   Medication Sig Start Date End Date Taking? Authorizing Provider  levothyroxine (SYNTHROID) 175 MCG tablet Take 175 mcg by mouth daily before breakfast.    [provider]  naproxen (NAPROSYN) 500 MG tablet Take 1 tablet (500 mg total) by mouth 2 (two) times daily with a meal. 01/28/21   Jaynee Eagles, PA-C  tiZANidine (ZANAFLEX) 4 MG tablet Take 1 tablet (4 mg total) by mouth every 8 (eight) hours as needed for muscle spasms. 01/28/21   Jaynee Eagles, PA-C    Allergies    Patient has no known allergies.  Review of Systems   Review of Systems  Constitutional:  Positive for fever.  HENT:  Negative for congestion and sore throat.   Respiratory:  Positive for cough and shortness of breath.   Cardiovascular:  Positive for chest pain.  Gastrointestinal:  Negative for nausea and vomiting.  Musculoskeletal:  Negative for back pain and myalgias.  Neurological:  Negative for headaches.   Physical Exam Updated Vital Signs BP (!) 143/91 (BP Location: Right Arm)   Pulse 63   Temp 98.2 F (36.8 C)   Resp 16   Ht 6\' 3"  (1.905 m)   Wt 93.4 kg   SpO2 100%   BMI 25.74 kg/m   Physical Exam Vitals and nursing note reviewed. Exam conducted with a chaperone present.  Constitutional:      Appearance: Normal appearance.     Comments: Patient sitting comfortably, well-appearing  HENT:     Head: Normocephalic and atraumatic.     Nose: No congestion.     Mouth/Throat:  Mouth: Mucous membranes are moist.     Pharynx: No posterior oropharyngeal erythema.  Eyes:     General: No scleral icterus.       Right eye: No discharge.        Left eye: No discharge.     Extraocular Movements: Extraocular movements intact.     Pupils: Pupils are equal, round, and reactive to light.  Cardiovascular:     Rate and Rhythm: Normal rate and regular rhythm.     Pulses: Normal pulses.     Heart sounds: Normal heart sounds. No murmur heard.   No friction rub. No gallop.     Comments: S1-S2 without any murmurs, rubs, gallops.  Radial pulse 2+ equal bilaterally Pulmonary:     Effort: Pulmonary effort is normal. No respiratory distress.     Breath sounds: Normal breath  sounds.     Comments: Lungs CTA bilaterally. No accessory muscle use. Speaking in complete sentences.  Abdominal:     General: Abdomen is flat. Bowel sounds are normal. There is no distension.     Palpations: Abdomen is soft.     Tenderness: There is no abdominal tenderness.  Musculoskeletal:     Cervical back: Normal range of motion.  Skin:    General: Skin is warm and dry.     Coloration: Skin is not jaundiced.  Neurological:     Mental Status: He is alert. Mental status is at baseline.     Coordination: Coordination normal.  Psychiatric:        Mood and Affect: Mood normal.    ED Results / Procedures / Treatments   Labs (all labs ordered are listed, but only abnormal results are displayed) Labs Reviewed  BASIC METABOLIC PANEL - Abnormal; Notable for the following components:      Result Value   Glucose, Bld 123 (*)    All other components within normal limits  CBC - Abnormal; Notable for the following components:   WBC 11.2 (*)    All other components within normal limits  RESP PANEL BY RT-PCR (FLU A&B, COVID) ARPGX2  TROPONIN I (HIGH SENSITIVITY)  TROPONIN I (HIGH SENSITIVITY)    EKG None  Radiology DG Chest 2 View  Result Date: 05/19/2021 CLINICAL DATA:  Chest pain, shortness of breath. EXAM: CHEST - 2 VIEW COMPARISON:  January 22, 2021. FINDINGS: The heart size and mediastinal contours are within normal limits. Both lungs are clear. The visualized skeletal structures are unremarkable. IMPRESSION: No active cardiopulmonary disease. Electronically Signed   By: Marijo Conception M.D.   On: 05/19/2021 12:28    Procedures Procedures   Medications Ordered in ED Medications - No data to display  ED Course  I have reviewed the triage vital signs and the nursing notes.  Pertinent labs & imaging results that were available during my care of the patient were reviewed by me and considered in my medical decision making (see chart for details).    MDM  Rules/Calculators/A&P                           Patient is mildly hypertensive, he has stable vitals and is nontoxic-appearing.  Physical exam is unremarkable, lungs are clear to auscultation and he is not tachycardic or hypoxic.    PERC negative, doubt PE.  Considered ACS, EKG is without any ST elevations or depressions.  Negative single troponin, chest pain is been constant since this morning so do not need a second.  Doubt ACS specially given age and the characteristics of the chest pain does not sound cardiac.    No gross electrolyte derangement, radiograph is negative for any signs of cardiomegaly which be concerning for heart failure or any pneumonia.  His COVID and flu tests are negative, given we do not test for RSV which his daughter I have a high suspicion that his symptoms are likely RSV/viral etiology.    Based on work-up and evaluation today I do not suspect any emergent cause of his symptoms at this time.  Will discharge patient with Ladona Ridgel for cough and work note, advised symptom management.  Patient discharged in stable condition.  Final Clinical Impression(s) / ED Diagnoses Final diagnoses:  None    Rx / DC Orders ED Discharge Orders     None        Sherrill Raring, PA-C 05/19/21 [place], [place], PA-C 05/19/21 [place], [place], DO 05/19/21 1439

## 2021-05-19 NOTE — Discharge Instructions (Addendum)
Expect URI symptoms to last for 14-21 days. Dry cough can last up to 4 weeks. To help yourself recover, drink plenty of fluids and get plenty of rest.   For fever, HA, sore throat or body aches - take tylenol (500-100 mg) every 8 hours. Do not exceed 3000mg /day. You ca also take ibuprofen.   For cough, take tessalon perles every 8 hours as needed. You can also use cough drops or drink honey.  Return if things worsen or change.

## 2021-05-19 NOTE — ED Notes (Signed)
Cough , heavy sharp pain in chest and raw throat  since yesterday , his daughter has RSV he states

## 2022-04-23 IMAGING — DX DG CHEST 2V
2 series · 2 of 2 positions shown · non-contrast
Comparison: January 22, 2021.

CLINICAL DATA: Chest pain, shortness of breath.

EXAM:
CHEST - 2 VIEW

[chest pa]
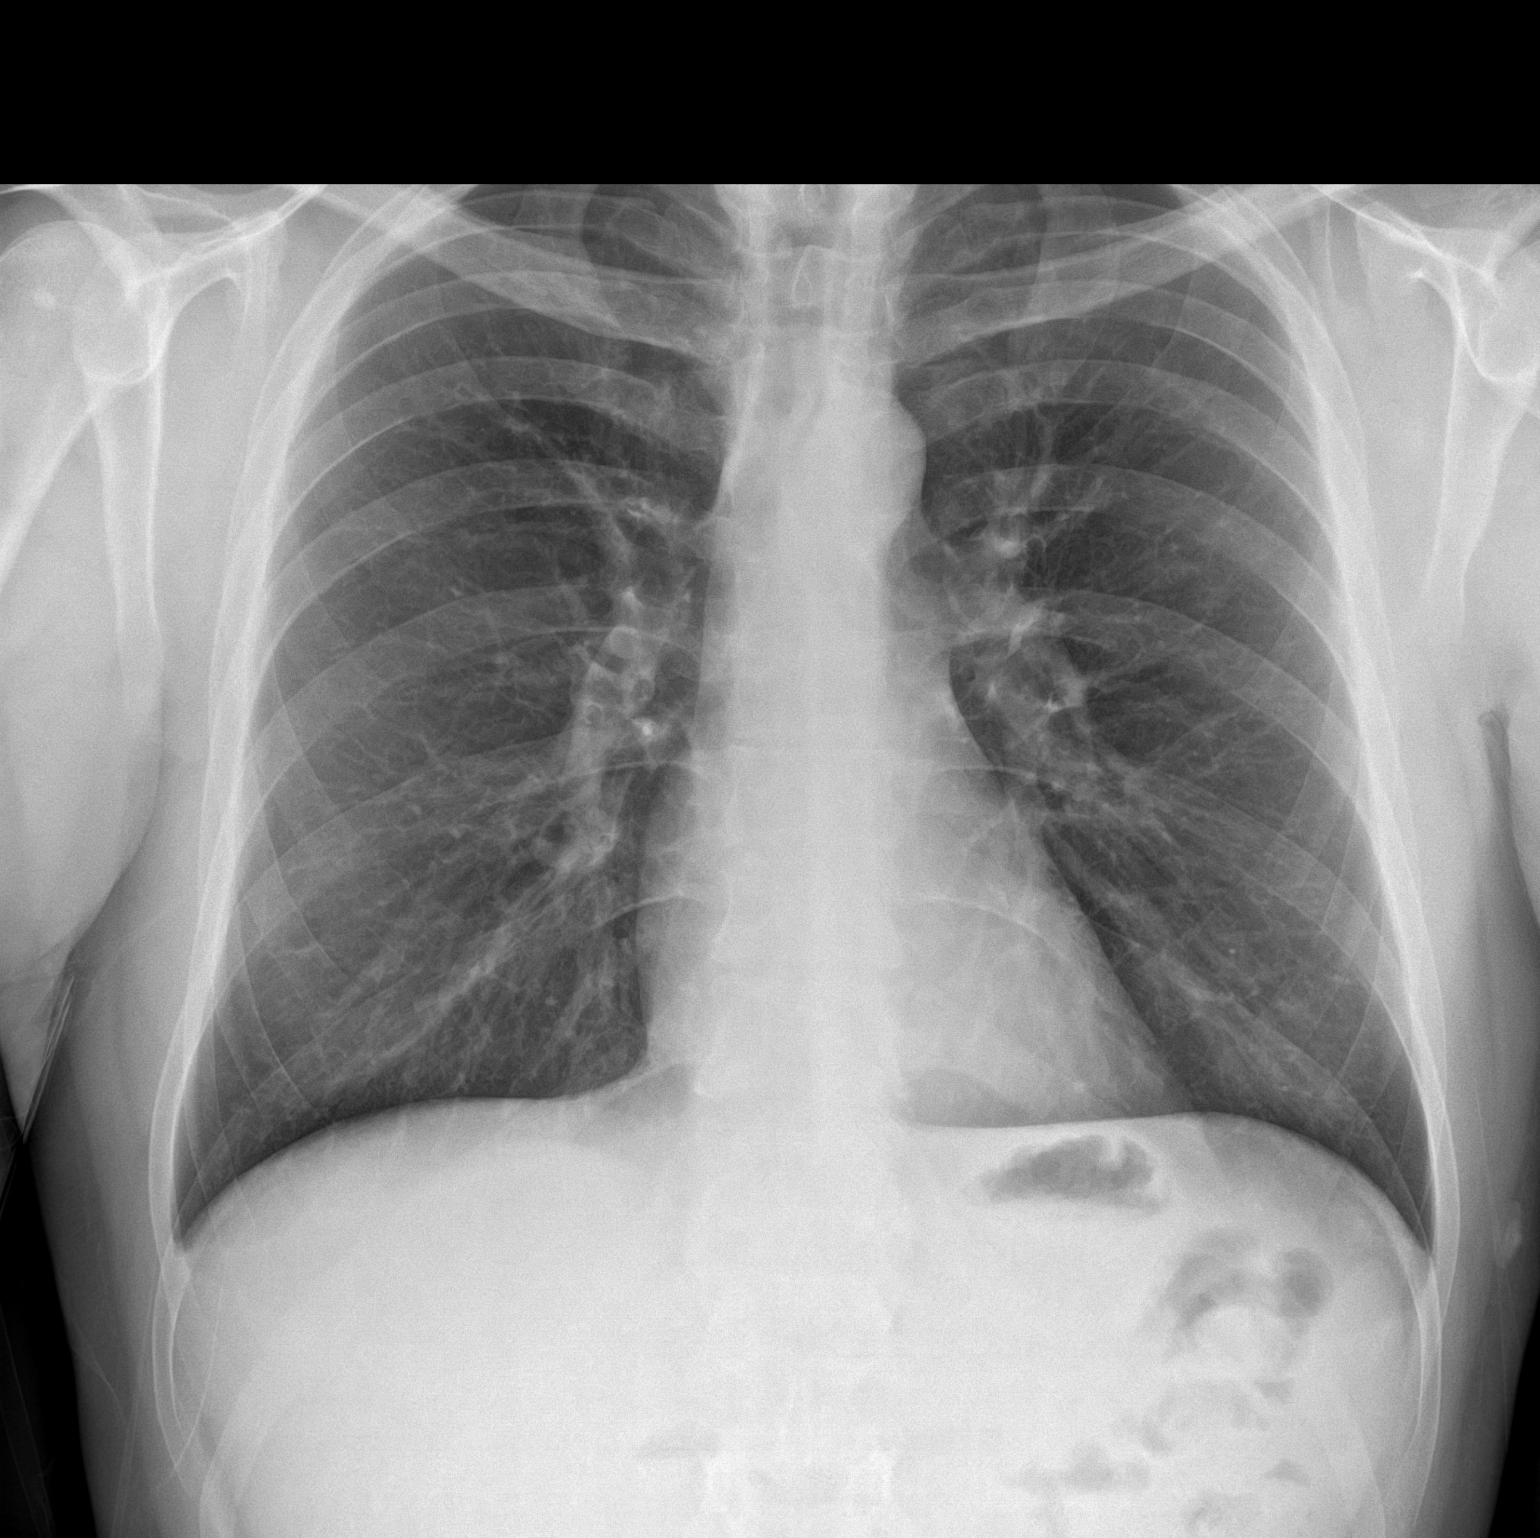

[chest lat]
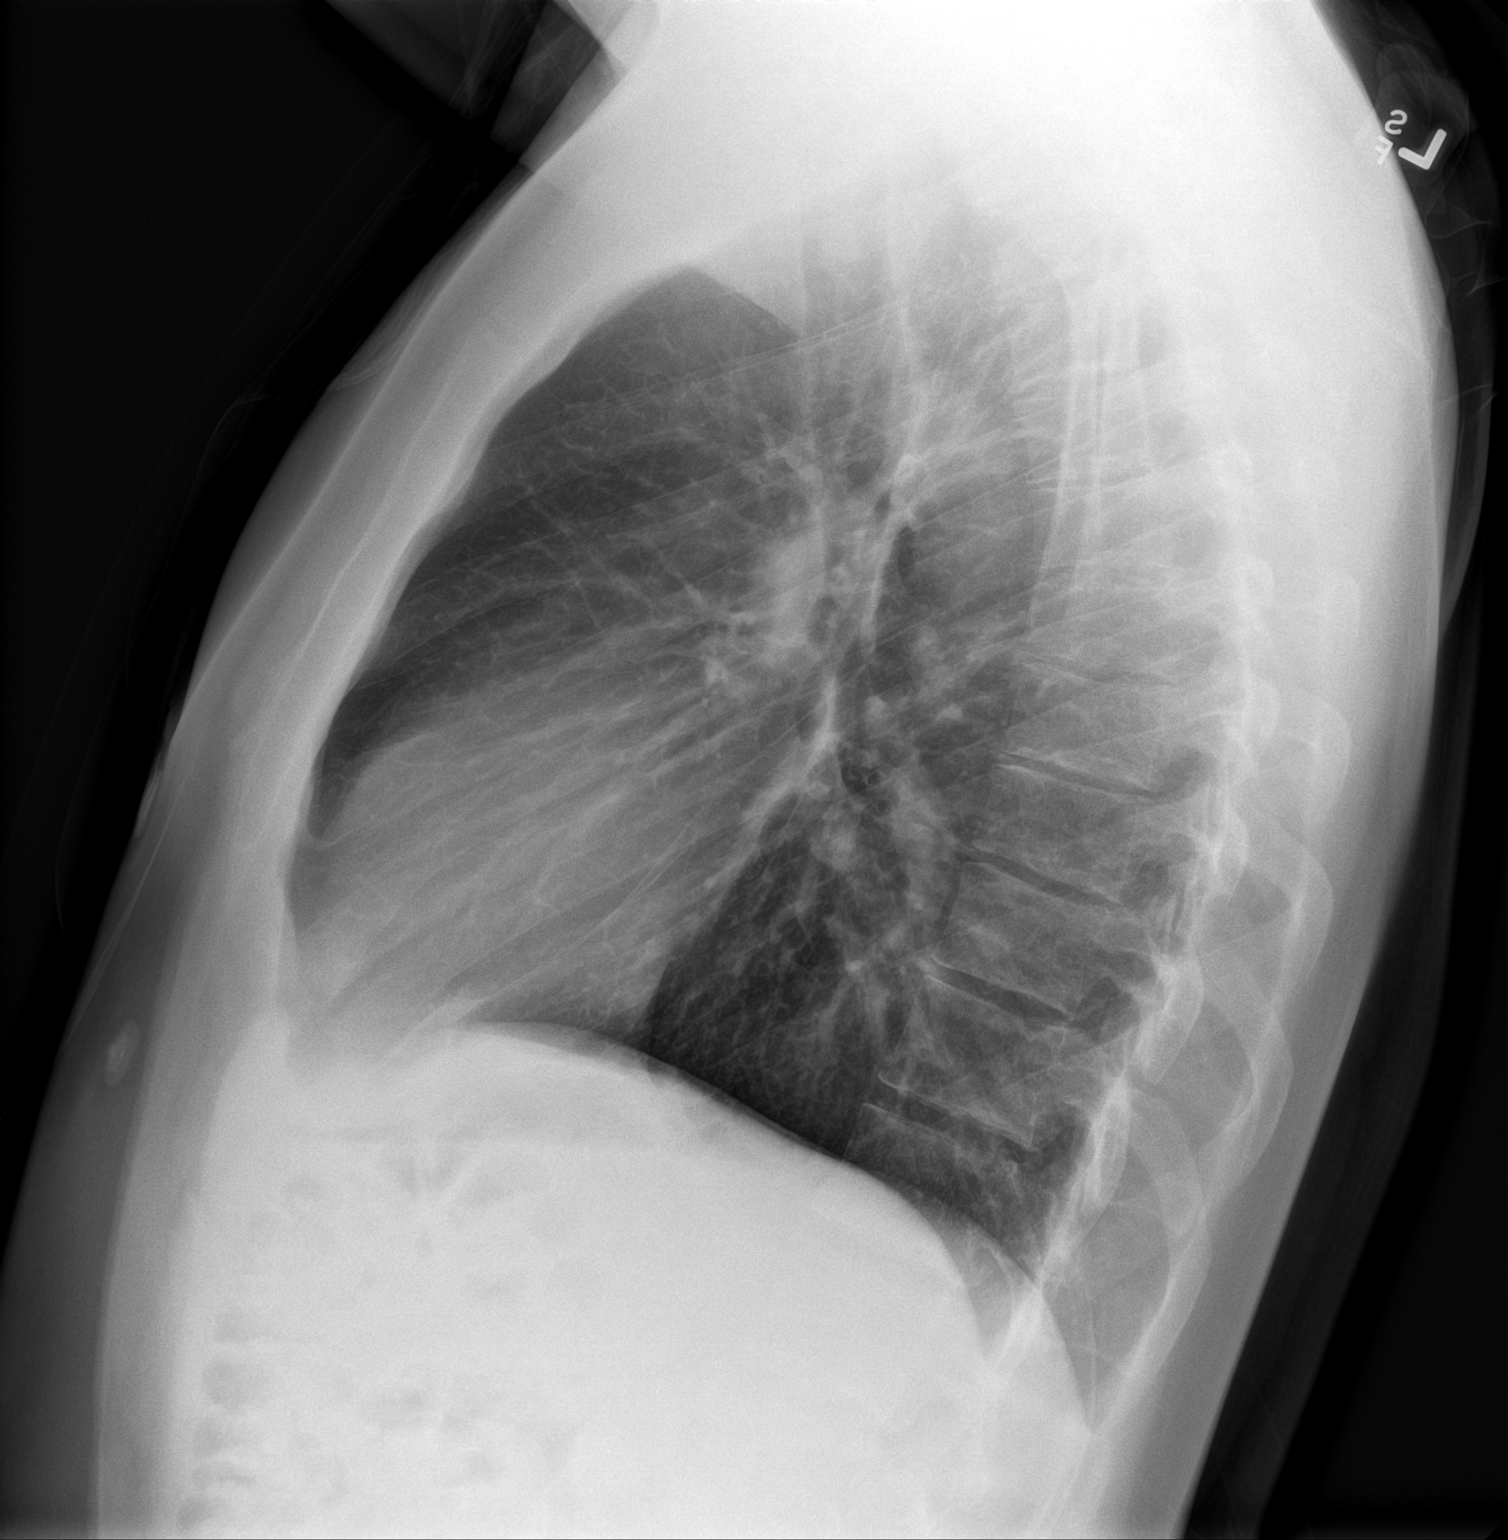

[2 of 2 positions shown; findings below may reference images not displayed]

FINDINGS: The heart size and mediastinal contours are within normal limits.
Both lungs are clear. The visualized skeletal structures are
unremarkable.
IMPRESSION: No active cardiopulmonary disease.
